# Patient Record
Sex: Male | Born: 1975 | Hispanic: Yes | Marital: Married | Smoking: Never smoker
Health system: Southern US, Community
[De-identification: ages and names within clinical notes are randomized; demographics above are authoritative.]

---

## 2015-11-20 ENCOUNTER — Emergency Department: Payer: No Typology Code available for payment source

## 2015-11-20 ENCOUNTER — Encounter: Payer: Self-pay | Admitting: Emergency Medicine

## 2015-11-20 ENCOUNTER — Emergency Department
Admission: EM | Admit: 2015-11-20 | Discharge: 2015-11-20 | Disposition: A | Discharge status: Home / Self Care | Payer: No Typology Code available for payment source | Attending: Emergency Medicine | Admitting: Emergency Medicine

## 2015-11-20 DIAGNOSIS — Y998 Other external cause status: Secondary | ICD-10-CM | POA: Insufficient documentation

## 2015-11-20 DIAGNOSIS — W2211XA Striking against or struck by driver side automobile airbag, initial encounter: Secondary | ICD-10-CM | POA: Diagnosis not present

## 2015-11-20 DIAGNOSIS — S50311A Abrasion of right elbow, initial encounter: Secondary | ICD-10-CM | POA: Insufficient documentation

## 2015-11-20 DIAGNOSIS — S40212A Abrasion of left shoulder, initial encounter: Secondary | ICD-10-CM | POA: Insufficient documentation

## 2015-11-20 DIAGNOSIS — Y9389 Activity, other specified: Secondary | ICD-10-CM | POA: Insufficient documentation

## 2015-11-20 DIAGNOSIS — S50811A Abrasion of right forearm, initial encounter: Secondary | ICD-10-CM | POA: Insufficient documentation

## 2015-11-20 DIAGNOSIS — Y92410 Unspecified street and highway as the place of occurrence of the external cause: Secondary | ICD-10-CM | POA: Insufficient documentation

## 2015-11-20 DIAGNOSIS — W2210XA Striking against or struck by unspecified automobile airbag, initial encounter: Secondary | ICD-10-CM

## 2015-11-20 DIAGNOSIS — T148XXA Other injury of unspecified body region, initial encounter: Secondary | ICD-10-CM

## 2015-11-20 DIAGNOSIS — S59911A Unspecified injury of right forearm, initial encounter: Secondary | ICD-10-CM | POA: Diagnosis present

## 2015-11-20 MED ORDER — TRAMADOL HCL 50 MG PO TABS: 50.0000 mg | ORAL_TABLET | Freq: Two times a day (BID) | ORAL | Status: AC

## 2015-11-20 MED ORDER — NAPROXEN 500 MG PO TBEC: 500.0000 mg | DELAYED_RELEASE_TABLET | Freq: Two times a day (BID) | ORAL | Status: AC

## 2015-11-20 MED ORDER — HYDROCODONE-ACETAMINOPHEN 5-325 MG PO TABS
1.0000 | ORAL_TABLET | Freq: Once | ORAL | Status: AC
  Administered 2015-11-20: 1 via ORAL
  Filled 2015-11-20: qty 1
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
## 2020-03-09 ENCOUNTER — Ambulatory Visit: Payer: Self-pay | Attending: Internal Medicine

## 2020-03-09 DIAGNOSIS — Z23 Encounter for immunization: Secondary | ICD-10-CM

## 2020-03-09 NOTE — Progress Notes (Signed)
   Covid-19 Vaccination Clinic  Name:  Connor Erickson    MRN: [id number] DOB: August 17, 1976  03/09/2020  Mr. Connor Erickson was observed post Covid-19 immunization for 15 minutes without incident. He was provided with Vaccine Information Sheet and instruction to access the V-Safe system.   Mr. Connor Erickson was instructed to call 911 with any severe reactions post vaccine: Marland Kitchen Difficulty breathing  . Swelling of face and throat  . A fast heartbeat  . A bad rash all over body  . Dizziness and weakness   Immunizations Administered    Name Date Dose VIS Date Route   Pfizer COVID-19 Vaccine 03/09/2020  8:41 AM 0.3 mL 11/26/2018 Intramuscular   Manufacturer: [employer], [employer]   Lot: [id number]   NDC: [address]-9

## 2020-04-01 ENCOUNTER — Ambulatory Visit: Payer: Self-pay | Attending: Internal Medicine

## 2020-04-01 DIAGNOSIS — Z23 Encounter for immunization: Secondary | ICD-10-CM

## 2020-04-01 NOTE — Progress Notes (Signed)
° °  Covid-19 Vaccination Clinic  Name:  Connor Erickson    MRN: [id number] DOB: 1976-03-15  04/01/2020  Mr. Connor Erickson was observed post Covid-19 immunization for 15 minutes without incident. He was provided with Vaccine Information Sheet and instruction to access the V-Safe system.   Mr. Connor Erickson was instructed to call 911 with any severe reactions post vaccine:  Difficulty breathing   Swelling of face and throat   A fast heartbeat   A bad rash all over body   Dizziness and weakness   Immunizations Administered    Name Date Dose VIS Date Route   Pfizer COVID-19 Vaccine 04/01/2020 10:25 AM 0.3 mL 11/26/2018 Intramuscular   Manufacturer: [employer], [employer]   Lot: [id number]   NDC: [address]-4

## 2021-09-29 ENCOUNTER — Other Ambulatory Visit: Payer: Self-pay

## 2021-09-29 ENCOUNTER — Emergency Department
Admission: EM | Admit: 2021-09-29 | Discharge: 2021-09-29 | Disposition: A | Discharge status: Home / Self Care | Payer: Self-pay | Attending: Emergency Medicine | Admitting: Emergency Medicine

## 2021-09-29 ENCOUNTER — Emergency Department: Payer: Self-pay

## 2021-09-29 DIAGNOSIS — U071 COVID-19: Secondary | ICD-10-CM | POA: Insufficient documentation

## 2021-09-29 DIAGNOSIS — R519 Headache, unspecified: Secondary | ICD-10-CM | POA: Insufficient documentation

## 2021-09-29 DIAGNOSIS — R030 Elevated blood-pressure reading, without diagnosis of hypertension: Secondary | ICD-10-CM | POA: Insufficient documentation

## 2021-09-29 LAB — BASIC METABOLIC PANEL
Anion gap: 7 (ref 5–15)
BUN: 18 mg/dL (ref 6–20)
CO2: 28 mmol/L (ref 22–32)
Calcium: 8.9 mg/dL (ref 8.9–10.3)
Chloride: 102 mmol/L (ref 98–111)
Creatinine, Ser: 0.69 mg/dL (ref 0.61–1.24)
GFR, Estimated: >60 mL/min (ref >60)
Glucose, Bld: 85 mg/dL (ref 70–99)
Potassium: 3.8 mmol/L (ref 3.5–5.1)
Sodium: 137 mmol/L (ref 135–145)

## 2021-09-29 LAB — RESP PANEL BY RT-PCR (FLU A&B, COVID) ARPGX2
Influenza A by PCR: NEGATIVE
Influenza B by PCR: NEGATIVE
SARS Coronavirus 2 by RT PCR: POSITIVE — AB

## 2021-09-29 LAB — CBC
HCT: 44.3 % (ref 39.0–52.0)
Hemoglobin: 15 g/dL (ref 13.0–17.0)
MCH: 31 pg (ref 26.0–34.0)
MCHC: 33.9 g/dL (ref 30.0–36.0)
MCV: 91.5 fL (ref 80.0–100.0)
Platelets: 253 10*3/uL (ref 150–400)
RBC: 4.84 MIL/uL (ref 4.22–5.81)
RDW: 12.1 % (ref 11.5–15.5)
WBC: 5.1 10*3/uL (ref 4.0–10.5)
nRBC: 0 % (ref 0.0–0.2)

## 2021-09-29 MED ORDER — IBUPROFEN 600 MG PO TABS
600.0000 mg | ORAL_TABLET | Freq: Once | ORAL | Status: AC
  Administered 2021-09-29: 13:00:00 600 mg via ORAL
  Filled 2021-09-29: qty 1

## 2021-09-29 MED ORDER — LOSARTAN POTASSIUM 25 MG PO TABS: 25.0000 mg | ORAL_TABLET | Freq: Every day | ORAL | 2 refills | Status: AC

## 2021-09-29 NOTE — ED Notes (Signed)
Pt taken to CT at this time. CT informed to take pt to ED 52 after test completed.

## 2021-09-29 NOTE — ED Provider Notes (Signed)
[facility] Emergency Department Provider Note  ____________________________________________   Event Date/Time   First MD Initiated Contact with Patient 09/29/21 1241     (approximate)  I have reviewed the triage vital signs and the nursing notes.   HISTORY  Chief Complaint Hypertension    HPI Connor Erickson is a 45 y.o. male presents emergency department complaining of a headache for about 1 months.  States it comes and goes.  Pain is always at the front part of his head and into the temples.  Patient has no history of hypertension or diabetes.  States the headache will go away with Tylenol and ibuprofen but he is having to take it every day.  He denies any neck pain.  Did have some fever and chills about 3 weeks ago.  Did have COVID 2 years ago.  No vomiting associated with a headache.  History reviewed. No pertinent past medical history.  There are no problems to display for this patient.   History reviewed. No pertinent surgical history.  Prior to Admission medications   Medication Sig Start Date End Date Taking? Authorizing Provider  losartan (COZAAR) 25 MG tablet Take 1 tablet (25 mg total) by mouth daily. 09/29/21 09/29/22 Yes Gavriella Hearst, Roselyn Bering, PA-C  naproxen (EC NAPROSYN) 500 MG EC tablet Take 1 tablet (500 mg total) by mouth 2 (two) times daily with a meal. 11/20/15   Menshew, Charlesetta Ivory, PA-C  traMADol (ULTRAM) 50 MG tablet Take 1 tablet (50 mg total) by mouth 2 (two) times daily. 11/20/15   Menshew, Charlesetta Ivory, PA-C    Allergies Patient has no known allergies.  No family history on file.  Social History Social History   Tobacco Use   Smoking status: Never    Review of Systems  Constitutional: No fever/chills Eyes: No visual changes. ENT: No sore throat. Respiratory: Denies cough Cardiovascular: Denies chest pain Gastrointestinal: Denies abdominal pain Genitourinary: Negative for dysuria. Musculoskeletal:  Negative for back pain. Skin: Negative for rash. Psychiatric: no mood changes,     ____________________________________________   PHYSICAL EXAM:  VITAL SIGNS: ED Triage Vitals  Enc Vitals Group     BP 09/29/21 1151 (!) 164/109     Pulse Rate 09/29/21 1151 72     Resp 09/29/21 1151 15     Temp 09/29/21 1151 98.6 F (37 C)     Temp Source 09/29/21 1151 Oral     SpO2 09/29/21 1151 98 %     Weight --      Height --      Head Circumference --      Peak Flow --      Pain Score 09/29/21 1141 7     Pain Loc --      Pain Edu? --      Excl. in GC? --     Constitutional: Alert and oriented. Well appearing and in no acute distress. Eyes: Conjunctivae are normal.  PERRL, EOMI Head: Atraumatic. Nose: No congestion/rhinnorhea. Mouth/Throat: Mucous membranes are moist.   Neck:  supple no lymphadenopathy noted Cardiovascular: Normal rate, regular rhythm. Heart sounds are normal Respiratory: Normal respiratory effort.  No retractions, lungs c t a  GU: deferred Musculoskeletal: FROM all extremities, warm and well perfused Neurologic:  Normal speech and language.  Skin:  Skin is warm, dry and intact. No rash noted. Psychiatric: Mood and affect are normal. Speech and behavior are normal.  ____________________________________________   LABS (all labs ordered are listed, but only abnormal  results are displayed)  Labs Reviewed  RESP PANEL BY RT-PCR (FLU A&B, COVID) ARPGX2 - Abnormal; Notable for the following components:      Result Value   SARS Coronavirus 2 by RT PCR POSITIVE (*)    All other components within normal limits  CBC  BASIC METABOLIC PANEL   ____________________________________________   ____________________________________________  RADIOLOGY  CT of the head  ____________________________________________   PROCEDURES  Procedure(s) performed: No  Procedures    ____________________________________________   INITIAL IMPRESSION / ASSESSMENT AND PLAN /  ED COURSE  Pertinent labs & imaging results that were available during my care of the patient were reviewed by me and considered in my medical decision making (see chart for details).   The patient is a 45 year old male presents emergency department with headache for close to 1 month.  See HPI.  Physical exam shows patient appears stable  DDx: Tension headache, hypertensive headache, covid, subdural, SAH, brain tumor  CBC and metabolic panel are normal, no endorgan damage is noted from the hypertension. Respiratory panel ordered CT of the head reviewed by me confirmed by radiology be negative for any acute abnormality  Respiratory panel shows positive for COVID  Did explain the findings to the patient.  I feel like the bad headache is mostly coming from covid.  However his blood pressure has remained elevated even with pain medication so we will start him on losartan 25 mg daily.  He is to follow-up with [place] health services.  He is to call for an appointment.  Recheck his blood pressure either at the pharmacy, fire station, or here in the ED if he is unable to get an appointment with [place] health services.  Patient is in agreement with treatment plan.  He was discharged stable condition.  Connor Erickson was evaluated in Emergency Department on 09/29/2021 for the symptoms described in the history of present illness. He was evaluated in the context of the global COVID-19 pandemic, which necessitated consideration that the patient might be at risk for infection with the SARS-CoV-2 virus that causes COVID-19. Institutional protocols and algorithms that pertain to the evaluation of patients at risk for COVID-19 are in a state of rapid change based on information released by regulatory bodies including the CDC and federal and state organizations. These policies and algorithms were followed during the patient's care in the ED.    As part of my medical decision making, I reviewed the  following data within the electronic MEDICAL RECORD NUMBER History obtained from family, Nursing notes reviewed and incorporated, Interpreter needed, Labs reviewed , Old chart reviewed, Radiograph reviewed , Notes from prior ED visits, and [place] Controlled Substance Database  ____________________________________________   FINAL CLINICAL IMPRESSION(S) / ED DIAGNOSES  Final diagnoses:  COVID-19  Bad headache  Elevated blood pressure reading      NEW MEDICATIONS STARTED DURING THIS VISIT:  New Prescriptions   LOSARTAN (COZAAR) 25 MG TABLET    Take 1 tablet (25 mg total) by mouth daily.     Note:  This document was prepared using Dragon voice recognition software and may include unintentional dictation errors.    Faythe Ghee, PA-C 09/29/21 1449    Georga Hacking, MD 09/29/21 [phone]

## 2021-09-29 NOTE — Discharge Instructions (Signed)
Take the losartan if your blood pressure is still elevated in 2 days Return if worsening

## 2021-09-29 NOTE — ED Triage Notes (Signed)
Pt comes with c/o headache that started month ago. Pt states it has come and gone. Pt states he went to walk in clinic today and his BP was elevated. Pt denies any BP issues.

## 2022-07-13 IMAGING — CT CT HEAD W/O CM
4 series · 16 of 47 positions shown, 18 images · non-contrast
Comparison: None.

CLINICAL DATA: A 45-year-old male presents with headache that
started 1 month ago.

EXAM:
CT HEAD WITHOUT CONTRAST
TECHNIQUE: Contiguous axial images were obtained from the base of the skull
through the vertex without intravenous contrast.

[Series 2: head bone · axial · 0.43mm/px · z∈[-132,-100]mm · 3 of 84 slices shown]
[im 9/84  bone]
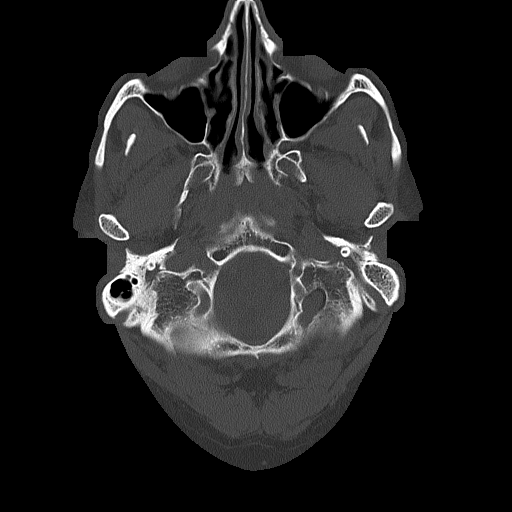
[im 17/84  bone]
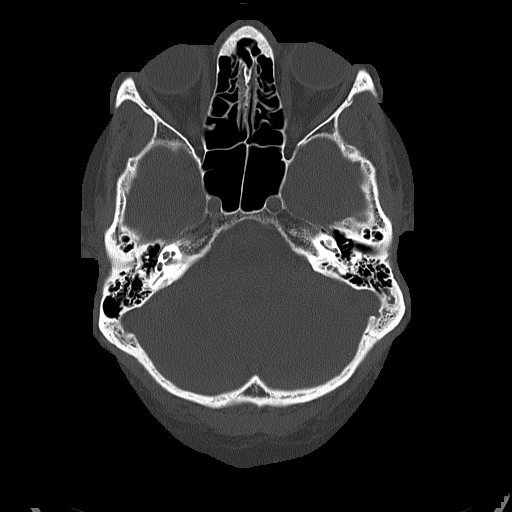
[im 25/84  bone]
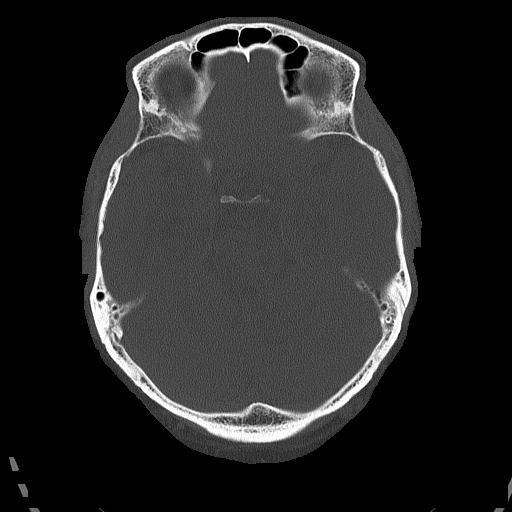

[Series 3: head wo · axial · 0.43mm/px · z∈[-128,-8]mm · 7 of 34 slices shown, 9 images]
[im 5/34  brain]
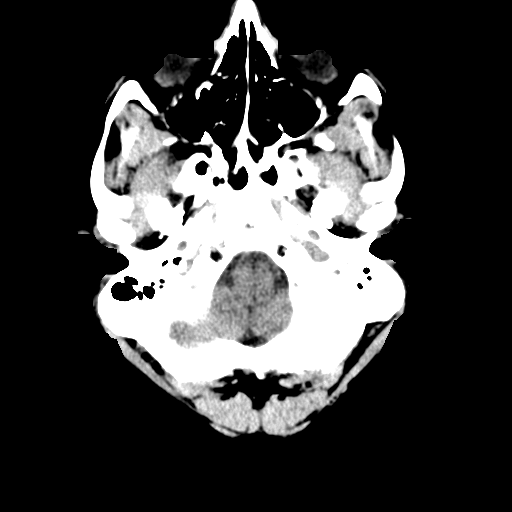
[im 5/34  bone]
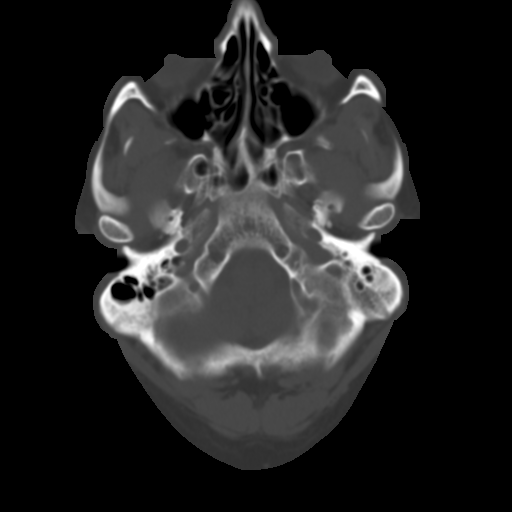
[im 9/34  brain]
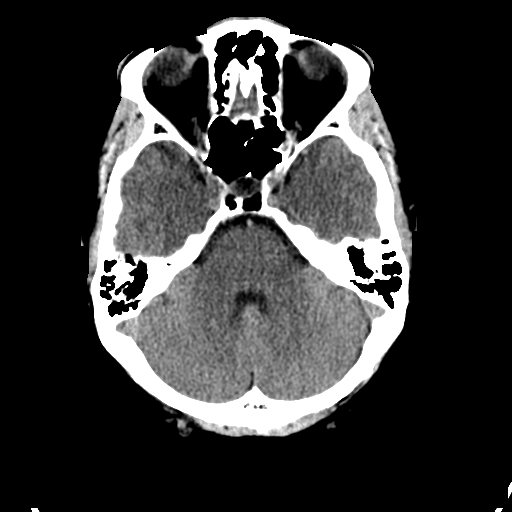
[im 13/34  brain]
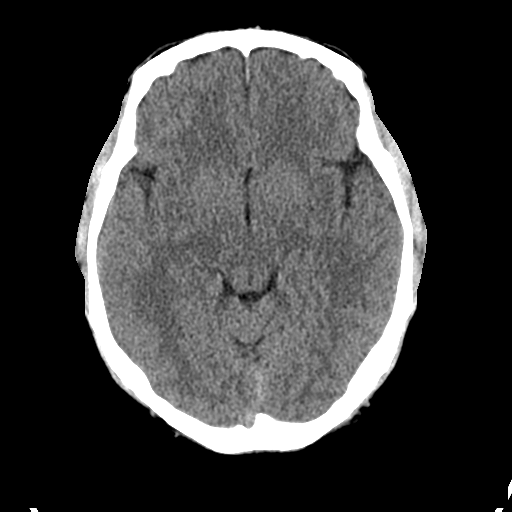
[im 17/34  brain]
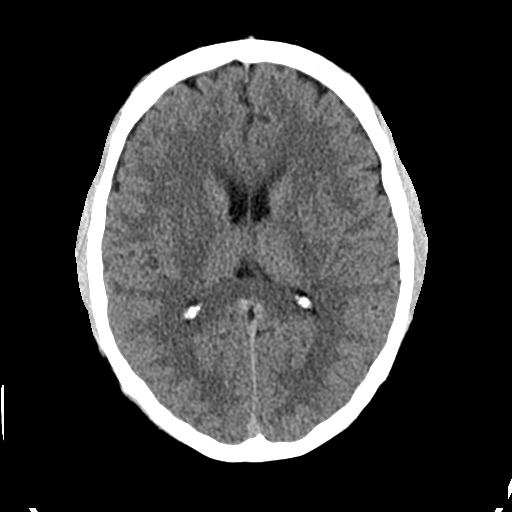
[im 21/34  brain]
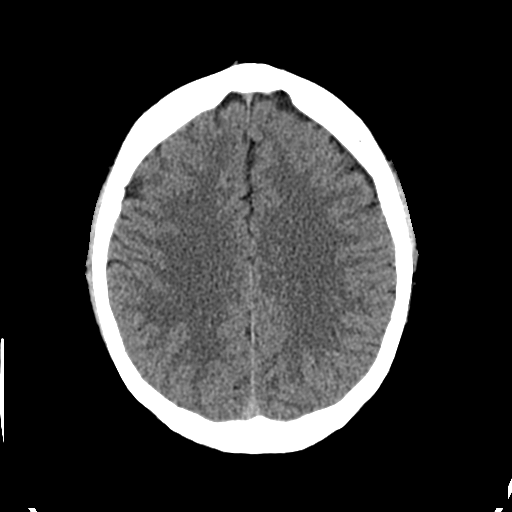
[im 21/34  bone]
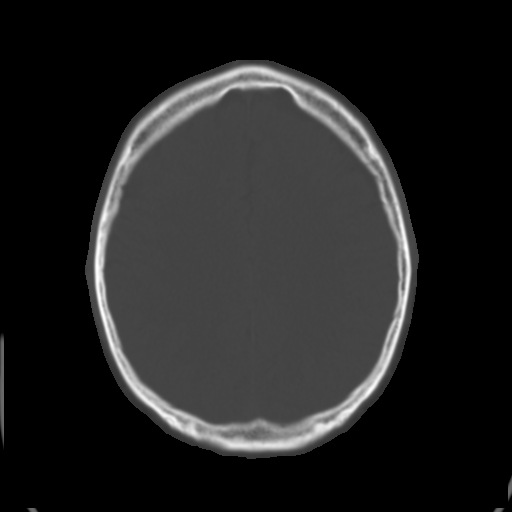
[im 25/34  brain]
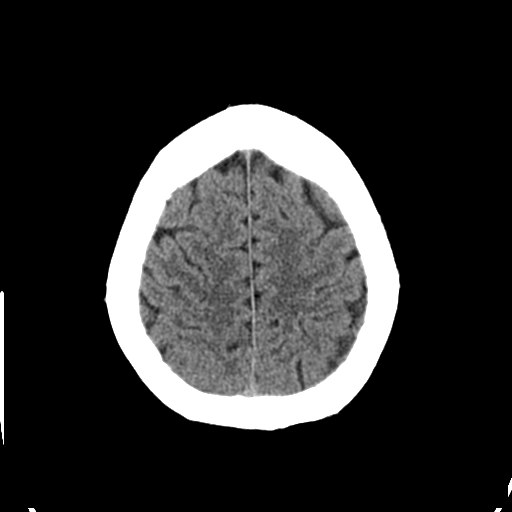
[im 29/34  brain]
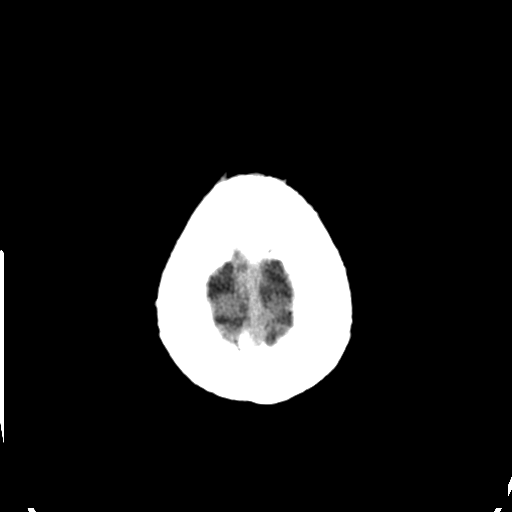

[Series 4: coronal soft tissue · coronal · 0.32mm/px · 3 of 74 slices shown]
[im 25/74  brain]
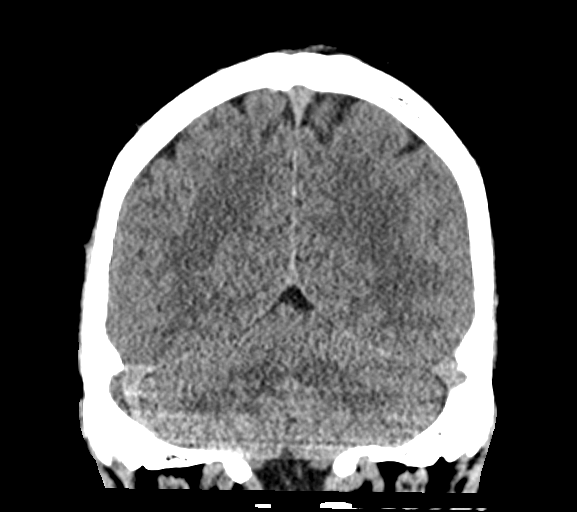
[im 33/74  brain]
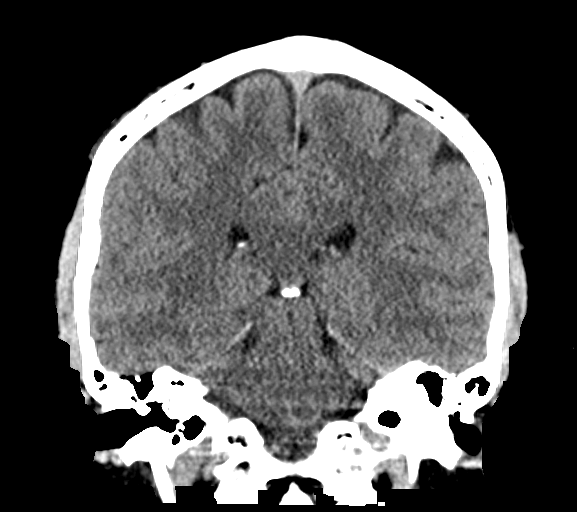
[im 41/74  brain]
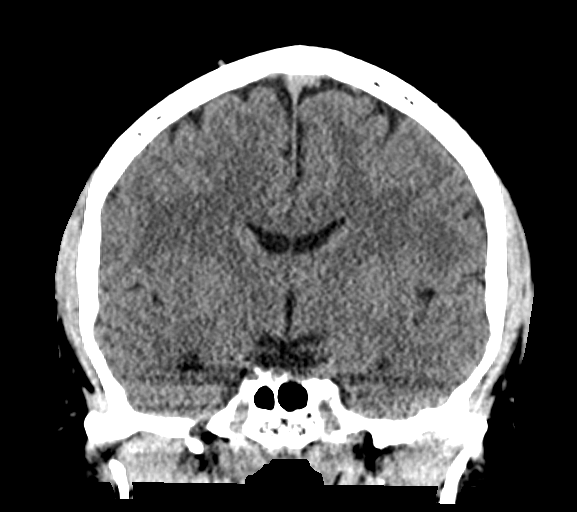

[Series 5: sagittal soft tissue · sagittal · 0.32mm/px · 3 of 63 slices shown]
[im 21/63  brain]
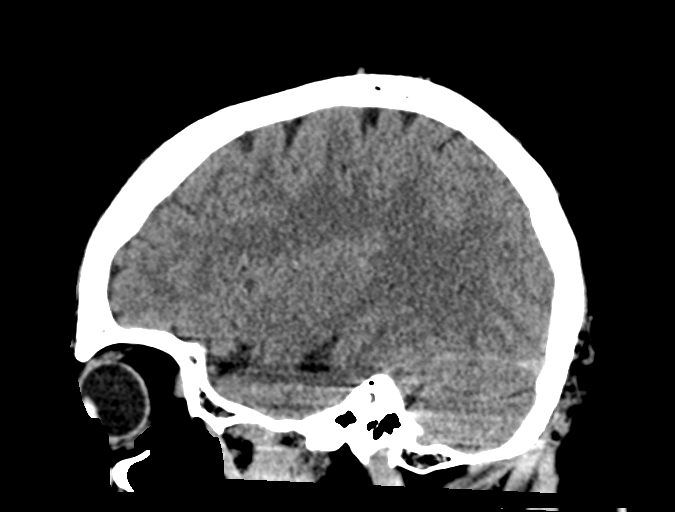
[im 32/63  brain]
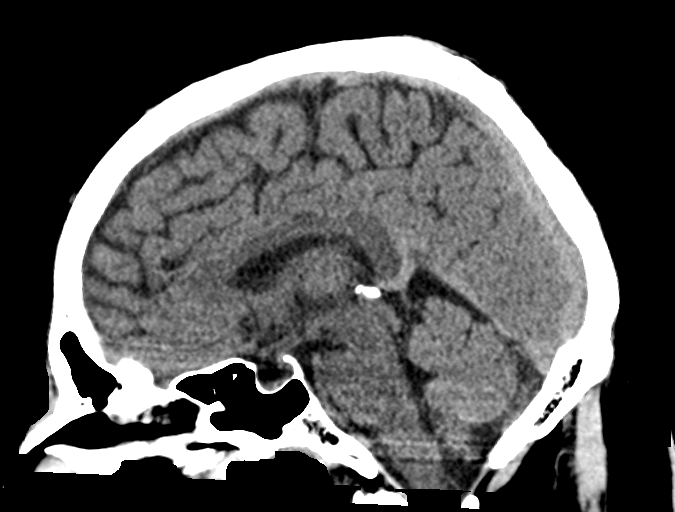
[im 42/63  brain]
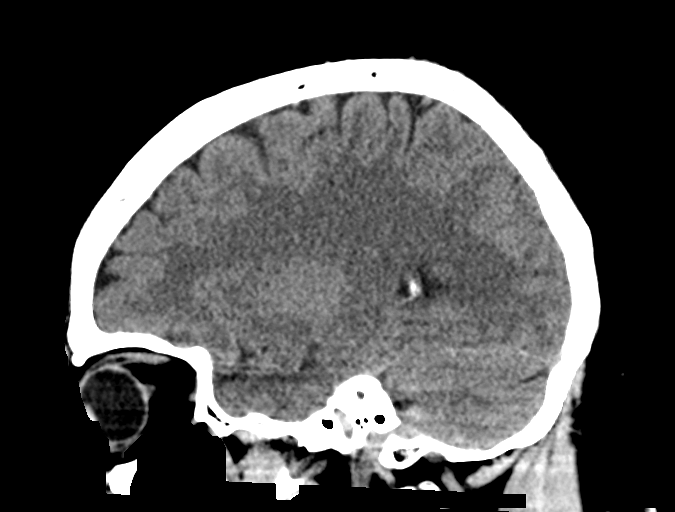

[16 of 47 positions shown; findings below may reference images not displayed]

FINDINGS: Brain: No evidence of acute infarction, hemorrhage, hydrocephalus,
extra-axial collection or mass lesion/mass effect.

Vascular: No hyperdense vessel or unexpected calcification.

Skull: Normal. Negative for fracture or focal lesion.

Sinuses/Orbits: Mucous retention cyst or polyp in the LEFT maxillary
sinus. Sinuses are incompletely visualized. No fluid level within
the sinuses. Visualized sinuses are otherwise clear. Orbits are
unremarkable.

Other: None
IMPRESSION: 1. No acute intracranial abnormality.
2. Mucous retention cyst or polyp in the LEFT maxillary sinus.

## 2024-02-21 ENCOUNTER — Ambulatory Visit: Payer: Self-pay | Admitting: Nurse Practitioner

## 2024-02-21 DIAGNOSIS — B977 Papillomavirus as the cause of diseases classified elsewhere: Secondary | ICD-10-CM

## 2024-02-21 DIAGNOSIS — Z113 Encounter for screening for infections with a predominantly sexual mode of transmission: Secondary | ICD-10-CM

## 2024-02-21 LAB — HM HIV SCREENING LAB: HM HIV Screening: NEGATIVE

## 2024-02-21 NOTE — Progress Notes (Signed)
 Pt is here for STD screening and cryo therapy. Condoms given and opportunity given to patient to ask questions for any clarifications. Austine Lefort, RN.

## 2024-02-25 LAB — CHLAMYDIA/GC NAA, CONFIRMATION
Chlamydia trachomatis, NAA: NEGATIVE
Neisseria gonorrhoeae, NAA: NEGATIVE

## 2024-02-26 LAB — GONOCOCCUS CULTURE

## 2024-02-29 ENCOUNTER — Ambulatory Visit (LOCAL_COMMUNITY_HEALTH_CENTER): Payer: Self-pay

## 2024-02-29 DIAGNOSIS — Z23 Encounter for immunization: Secondary | ICD-10-CM

## 2024-02-29 DIAGNOSIS — Z719 Counseling, unspecified: Secondary | ICD-10-CM

## 2024-02-29 NOTE — Progress Notes (Signed)
 In nurse clinic requesting Tdap and HPV.  Patient explains that [facility] provider had recommended HPV vaccine during his STI visit on 02/21/2024. 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   [facility] record with Covid vaccines only. Patient states he has no other vaccine record.   Per [facility], HPV vaccine is not recommended for those over 47 yo. Also Gardisil 9 drug package insert also states not recommended over age 13. Consult Dr Bohdan Bush and Claryce Cruel, FNP and informed  of patient status. Both  confirm this guidance regarding HPV vaccine. Patient is 48 yo and therefore, unable to get HPV vaccine RN explained this to patient. Questions answered and verbalizes understanding.   Meets state criteria for free Tdap.  Tdap given and tolerated well. Updated [facility] copy given and reviewed.   At end of visit, patient inquires about other vaccines he is eligible for. RN counseled on all recommended vaccines.  Patient expresses interest in Hep B, MMR, Polio, Varicella. He does not have insurance.   Vaccine fees and [employer] Patient Assistance Program discussed and application started. After his appt, Patient returns to [facility] with his tax returns. RN made copy so that this can be submitted for Merck PAP application. RN will plan to submit Merck PAP before patient's next appt.   Next immunization appt scheduled for 03/05/2024 at 8:30 am. Appt card given.    Interpreters for visit: Melven Stable [place] and pacific interpreter id [phone]. Falecia Vannatter, RN

## 2024-02-29 NOTE — Progress Notes (Signed)
 Attestation of Attending Supervision of RN:  I was consulted regarding this patient case and agree with the documentation below.   Tempie Fee, MD Clinical Services Medical Director [facility] Department 02/29/24  5:25 PM

## 2024-03-05 ENCOUNTER — Ambulatory Visit (LOCAL_COMMUNITY_HEALTH_CENTER): Payer: Self-pay

## 2024-03-05 DIAGNOSIS — Z719 Counseling, unspecified: Secondary | ICD-10-CM

## 2024-03-05 DIAGNOSIS — Z23 Encounter for immunization: Secondary | ICD-10-CM

## 2024-03-05 NOTE — Progress Notes (Signed)
 In nurse clinic requesting recommended immunizations.   Merck PAP application approved for Varicella and Hep B on 03/04/2024.  Meets state criteria for MMR which was given at no charge to patient.  Paid for polio today.   Vaccines given: Hep B, Polio, MMR, Varicella. Tolerated well. Updated [facility] copy given and recommended schedule explained.   Vaccine administration for Merck PAP for varicella and Hep B today  successfully faxed to [employer].   Will plan to re submit Merck PAP for next dose of MMR, Varicella, Hep B. [place] interpreter id [phone] for visit today. Onesty Clair, RN

## 2024-03-11 ENCOUNTER — Encounter: Payer: Self-pay | Admitting: Nurse Practitioner

## 2024-03-11 NOTE — Progress Notes (Signed)
 [facility] Department STI clinic [address], [address] [place] [address] Main phone: [phone]  STI screening visit  Subjective:  Connor Erickson is a 48 y.o. male being seen today for an STI screening visit. The patient reports they do have symptoms.    Patient has the following medical conditions:  There are no active problems to display for this patient.  Chief Complaint  Patient presents with   SEXUALLY TRANSMITTED DISEASE    HPI Patient is a pleasant 48 y.o. male who presents to the office today requesting symptomatic STI testing and cryotherapy for genital warts.  Patient indicated symptoms include three warts present to pubic area (2) and to shaft of penis (1).  He reports 1 male partner in the last 2 months, and practices penile/vaginal penetrative sex and oral sex. Patient reports using condoms always. Patient indicates a history of HPV. He reports last sex was 1 day ago.   See flowsheet for further details and programmatic requirements  Hyperlink available at the top of the signed note in blue.  Flow sheet content below:  Pregnancy Intention Screening Does the patient want to become pregnant in the next year?: No Does the patient's partner want to become pregnant in the next year?: No Would the patient like to discuss contraceptive options today?: N/A Reason For STD Screen STD Screening: Has symptoms Have you ever had an STD?: Yes History of Antibiotic use in the past 2 weeks?: No STD Symptoms Denies all: No Genital Itching: No Lower abdominal pain: No Discharge: No Dysuria: No Genital ulcer / lesion: Yes Genital lesion s/s: Present for 3 months. Rash: No Oral / Other skin ulcer: No Pain with sex: No Sore Throat: No Visual Changes: No Other (Describe in Comments): No Risk Factors for Hep B Household, sexual, or needle sharing contact of a person infected with Hep B: No Sexual contact with a person who uses  drugs not as prescribed?: No Currently or Ever used drugs not as prescribed: No HIV Positive: No PRep Patient: No Men who have sex with men: No Have Hepatitis C: No History of Incarceration: No History of Homeslessness?: No Anal sex following anal drug use?: No Risk Factors for Hep C Currently using drugs not as prescribed: No Sexual partner(s) currently using drugs as not prescribed: No History of drug use: No HIV Positive: No People with a history of incarceration: No People born between the years of 36 and 70: No Abuse History Has patient ever been abused physically?: No Has patient ever been abused sexually?: No Does patient feel they have a problem with Anxiety?: No Does patient feel they have a problem with Depression?: No Referral to Behavioral Health: N/A Counseling Patient counseled to use condoms with all sex: Condoms given RTC in 2-3 weeks for test results: Yes Clinic will call if test results abnormal before test result appt.: Yes Test results given to patient Patient counseled to use condoms with all sex: Condoms given Contraception Wrap Up Current Method: Male Condom End Method: Male Condom Contraception Counseling Provided: Yes How was the end contraceptive method provided?: Provided on site  Screening for MPX risk: Does the patient have an unexplained rash? No Is the patient MSM? No Does the patient endorse multiple sex partners or anonymous sex partners? No Did the patient have close or sexual contact with a person diagnosed with MPX? No Has the patient traveled outside the [place]  where MPX is endemic? No Is there a high clinical suspicion for MPX--  evidenced by one of the following No  -Unlikely to be chickenpox  -Lymphadenopathy  -Rash that present in same phase of evolution on any given body part  STI screening history: Last HIV test per patient/review of record was  Lab Results  Component Value Date   HMHIVSCREEN Negative - Validated 02/21/2024    No results found for: "HIV"  Last HEPC test per patient/review of record was No results found for: "HMHEPCSCREEN" No components found for: "HEPC"   Last HEPB test per patient/review of record was No components found for: "HMHEPBSCREEN"   Fertility: Does the patient or their partner desires a pregnancy in the next year? No  Immunization History  Administered Date(s) Administered   Hepatitis B, ADULT 03/05/2024   IPV 03/05/2024   MMR 03/05/2024   PFIZER(Purple Top)SARS-COV-2 Vaccination 03/09/2020, 04/01/2020, 10/27/2020   Tdap 02/29/2024   Varicella 03/05/2024    The following portions of the patient's history were reviewed and updated as appropriate: allergies, current medications, past medical history, past social history, past surgical history and problem list.  Objective:  There were no vitals filed for this visit.  Physical Exam Nursing note reviewed.  Constitutional:      Appearance: Normal appearance.  HENT:     Head: Normocephalic.     Salivary Glands: Right salivary gland is not diffusely enlarged or tender. Left salivary gland is not diffusely enlarged or tender.     Mouth/Throat:     Lips: Pink. No lesions.     Mouth: Mucous membranes are moist. No oral lesions.     Tongue: No lesions. Tongue does not deviate from midline.     Pharynx: Oropharynx is clear. Uvula midline. No oropharyngeal exudate or posterior oropharyngeal erythema.     Tonsils: No tonsillar exudate.  Eyes:     General:        Right eye: No discharge.        Left eye: No discharge.     Conjunctiva/sclera:     Right eye: Right conjunctiva is not injected. No exudate.    Left eye: Left conjunctiva is not injected. No exudate. Pulmonary:     Effort: Pulmonary effort is normal.  Genitourinary:    Pubic Area: No rash or pubic lice.      Penis: Lesions present. No tenderness, discharge or swelling.      Testes: Normal.     Epididymis:     Right: Normal. No mass or tenderness.     Left: Normal. No  mass or tenderness.     Tanner stage (genital): 5.       Comments: Patient with 3 warts to pubic and penile shaft areas.  Lymphadenopathy:     Head:     Right side of head: No submental, submandibular, tonsillar, preauricular or posterior auricular adenopathy.     Left side of head: No submental, submandibular, tonsillar, preauricular or posterior auricular adenopathy.     Cervical: No cervical adenopathy.     Right cervical: No superficial or posterior cervical adenopathy.    Left cervical: No superficial or posterior cervical adenopathy.     Upper Body:     Right upper body: No supraclavicular or axillary adenopathy.     Left upper body: No supraclavicular or axillary adenopathy.     Lower Body: No right inguinal adenopathy. No left inguinal adenopathy.  Skin:    General: Skin is warm and dry.     Findings: Lesion present. No rash.     Comments: Skin tone appropriate for  ethnicity.  See GU section for more information about warts present.   Neurological:     Mental Status: He is alert and oriented to person, place, and time.  Psychiatric:        Attention and Perception: Attention and perception normal.        Mood and Affect: Mood and affect normal.        Speech: Speech normal.        Behavior: Behavior normal. Behavior is cooperative.        Thought Content: Thought content normal.    Assessment and Plan:  Connor Erickson is a 48 y.o. male presenting to the [facility] Department for STI screening  1. Screening for venereal disease (Primary)  - Gonococcus culture - [facility] - Chlamydia/GC NAA, Confirmation - Syphilis Serology, [facility]  2. HPV (human papilloma virus) infection Patient inquires today about receiving the HPV vaccine because his wife's doctor recommended it for both patient and wife at her visit. I consulted Earleen Glazier, FNP-C who stated the cut off age for HPV vaccine is 47 years old.  Patient reports he would make  an appointment with the heath department for this.   Patient opts to proceed with cryotx today after discussion about treatment options including Imiquimod cream, and the risks and benefits of each option.  Cryo treatment in 3 freeze/thaw cycles today.  Patient tolerated procedure well.  Reviewed with patient after-care instructions and when to call clinic. No sex until area has completely healed. Rec condoms with all sex RTC in 3-4 weeks  for next treatment if needed.   - Cryotherapy/destruct benign or premalignant lesion   Patient does have STI symptoms Patient accepted the following screenings: oral GC culture, urine CT/GC, HIV, and RPR Patient meets criteria for HepB screening? No. Ordered? not applicable Patient meets criteria for HepC screening? No. Ordered? not applicable Recommended condom use with all sex Discussed importance of condom use for STI prevention  Treat positive test results per standing order. Discussed time line for [facility] results and that patient will be called with positive results and encouraged patient to call if he had not heard in 2 weeks Recommended repeat testing in 3 months with positive results. Recommended returning for continued or worsening symptoms.   Return if symptoms worsen or fail to improve.  No future appointments.  Total time with patient 30 minutes.   Merleen Stare, NP

## 2024-05-16 ENCOUNTER — Ambulatory Visit (LOCAL_COMMUNITY_HEALTH_CENTER): Payer: Self-pay

## 2024-05-16 DIAGNOSIS — Z23 Encounter for immunization: Secondary | ICD-10-CM

## 2024-05-16 DIAGNOSIS — Z719 Counseling, unspecified: Secondary | ICD-10-CM

## 2024-05-17 NOTE — Progress Notes (Signed)
 In nurse clinic requesting recommended immunizations.    [employer] PAP application approved for MMR, Varicella and Hep B on 05/16/2024.  Meets state criteria for Tdap which was given at no charge to patient.  Paid for polio today.    Vaccines given: Hep B, Polio, MMR, Tdap and Varicella. Tolerated well. Updated [facility] copy given and recommended schedule explained.    Vaccine administration for [employer] PAP for MMR, varicella and Hep B today  successfully faxed to [employer].    Will plan to re submit [employer] PAP for next dose of Hep B due 07/11/2024. [place] Language interpreter id # [id number] for visit today.

## 2024-06-30 ENCOUNTER — Ambulatory Visit: Payer: Self-pay

## 2024-07-21 ENCOUNTER — Ambulatory Visit: Payer: Self-pay

## 2024-08-15 ENCOUNTER — Ambulatory Visit (LOCAL_COMMUNITY_HEALTH_CENTER): Payer: Self-pay

## 2024-08-15 DIAGNOSIS — Z23 Encounter for immunization: Secondary | ICD-10-CM

## 2024-08-15 DIAGNOSIS — Z719 Counseling, unspecified: Secondary | ICD-10-CM

## 2024-08-15 NOTE — Progress Notes (Signed)
 In nurse clinic for immunizations. Requesting Hep B and flu today.  No health insurance. Discussed option of [employer] Patient Assistance Program  for Hep B today, but patient declines and agrees to pay for vaccines today. States he needs to get back to work.  Counseled on all vaccines.  Hep B and flu given and tolerated well. Updated [facility] copy given and reviewed. LILLETTE Roche, interpreter. Vester Balthazor, RN
